# Patient Record
Sex: Female | Born: 1955 | Race: Black or African American | Hispanic: No | Marital: Single | Smoking: Current every day smoker
Health system: Southern US, Community
[De-identification: ages and names within clinical notes are randomized; demographics above are authoritative.]

## PROBLEM LIST (undated history)

## (undated) DIAGNOSIS — I2510 Atherosclerotic heart disease of native coronary artery without angina pectoris: Secondary | ICD-10-CM

## (undated) DIAGNOSIS — I10 Essential (primary) hypertension: Secondary | ICD-10-CM

---

## 2017-05-28 ENCOUNTER — Emergency Department: Payer: Self-pay

## 2017-05-28 ENCOUNTER — Encounter: Payer: Self-pay | Admitting: Emergency Medicine

## 2017-05-28 ENCOUNTER — Emergency Department
Admission: EM | Admit: 2017-05-28 | Discharge: 2017-05-28 | Disposition: A | Discharge status: Home / Self Care | Payer: Self-pay | Attending: Emergency Medicine | Admitting: Emergency Medicine

## 2017-05-28 DIAGNOSIS — L0211 Cutaneous abscess of neck: Secondary | ICD-10-CM | POA: Insufficient documentation

## 2017-05-28 DIAGNOSIS — I10 Essential (primary) hypertension: Secondary | ICD-10-CM | POA: Insufficient documentation

## 2017-05-28 DIAGNOSIS — L0291 Cutaneous abscess, unspecified: Secondary | ICD-10-CM

## 2017-05-28 DIAGNOSIS — F17210 Nicotine dependence, cigarettes, uncomplicated: Secondary | ICD-10-CM | POA: Insufficient documentation

## 2017-05-28 DIAGNOSIS — I2510 Atherosclerotic heart disease of native coronary artery without angina pectoris: Secondary | ICD-10-CM | POA: Insufficient documentation

## 2017-05-28 HISTORY — DX: Atherosclerotic heart disease of native coronary artery without angina pectoris: I25.10

## 2017-05-28 HISTORY — DX: Essential (primary) hypertension: I10

## 2017-05-28 LAB — CBC WITH DIFFERENTIAL/PLATELET
BASOS ABS: 0.1 10*3/uL (ref 0–0.1)
BASOS PCT: 1 %
Eosinophils Absolute: 0.2 10*3/uL (ref 0–0.7)
Eosinophils Relative: 2 %
HEMATOCRIT: 39.7 % (ref 35.0–47.0)
Hemoglobin: 13.6 g/dL (ref 12.0–16.0)
Lymphocytes Relative: 11 %
Lymphs Abs: 1.7 10*3/uL (ref 1.0–3.6)
MCH: 33.6 pg (ref 26.0–34.0)
MCHC: 34.3 g/dL (ref 32.0–36.0)
MCV: 98.1 fL (ref 80.0–100.0)
MONOS PCT: 9 %
Monocytes Absolute: 1.4 10*3/uL — ABNORMAL HIGH (ref 0.2–0.9)
NEUTROS ABS: 11.6 10*3/uL — AB (ref 1.4–6.5)
NEUTROS PCT: 77 %
Platelets: 328 10*3/uL (ref 150–440)
RBC: 4.05 MIL/uL (ref 3.80–5.20)
RDW: 15.5 % — ABNORMAL HIGH (ref 11.5–14.5)
WBC: 14.9 10*3/uL — ABNORMAL HIGH (ref 3.6–11.0)

## 2017-05-28 LAB — COMPREHENSIVE METABOLIC PANEL
ALK PHOS: 65 U/L (ref 38–126)
ALT: 11 U/L — ABNORMAL LOW (ref 14–54)
ANION GAP: 9 (ref 5–15)
AST: 15 U/L (ref 15–41)
Albumin: 3.8 g/dL (ref 3.5–5.0)
BUN: 11 mg/dL (ref 6–20)
CALCIUM: 9.7 mg/dL (ref 8.9–10.3)
CO2: 29 mmol/L (ref 22–32)
Chloride: 103 mmol/L (ref 101–111)
Creatinine, Ser: 0.42 mg/dL — ABNORMAL LOW (ref 0.44–1.00)
GFR calc non Af Amer: >60 mL/min (ref >60)
Glucose, Bld: 104 mg/dL — ABNORMAL HIGH (ref 65–99)
POTASSIUM: 3.6 mmol/L (ref 3.5–5.1)
SODIUM: 141 mmol/L (ref 135–145)
TOTAL PROTEIN: 8 g/dL (ref 6.5–8.1)
Total Bilirubin: 1.4 mg/dL — ABNORMAL HIGH (ref 0.3–1.2)

## 2017-05-28 LAB — LACTIC ACID, PLASMA: LACTIC ACID, VENOUS: 0.7 mmol/L (ref 0.5–1.9)

## 2017-05-28 MED ORDER — SULFAMETHOXAZOLE-TRIMETHOPRIM 800-160 MG PO TABS: 1.0000 | ORAL_TABLET | Freq: Two times a day (BID) | ORAL | 0 refills | Status: AC

## 2017-05-28 MED ORDER — CEFTRIAXONE SODIUM 250 MG IJ SOLR
250.0000 mg | INTRAMUSCULAR | Status: DC
  Administered 2017-05-28: 250 mg via INTRAMUSCULAR
  Filled 2017-05-28: qty 250

## 2017-05-28 MED ORDER — OXYCODONE-ACETAMINOPHEN 5-325 MG PO TABS: 1.0000 | ORAL_TABLET | Freq: Three times a day (TID) | ORAL | 0 refills | Status: AC | PRN

## 2017-05-28 MED ORDER — OXYCODONE-ACETAMINOPHEN 5-325 MG PO TABS
2.0000 | ORAL_TABLET | Freq: Once | ORAL | Status: AC
  Administered 2017-05-28: 2 via ORAL
  Filled 2017-05-28: qty 2

## 2017-05-28 MED ORDER — LIDOCAINE-EPINEPHRINE 1 %-1:100000 IJ SOLN
10.0000 mL | Freq: Once | INTRAMUSCULAR | Status: DC
  Filled 2017-05-28: qty 10

## 2017-05-28 NOTE — ED Provider Notes (Addendum)
[facility], [facility] Emergency Department Provider Note       Time seen: ----------------------------------------- 11:58 AM on 05/28/2017 -----------------------------------------     I have reviewed the triage vital signs and the nursing notes.   HISTORY   Chief Complaint No chief complaint on file.    HPI Heidi CoteVanessa Underwood is a 61 y.o. female with a history of hypertension who presents to the ED for a facial abscess. She was sent from urgent care for swelling to the chin and face. Reportedly she had dental surgery 6 weeks ago and she recently had angioedema from lisinopril. She just took a course of amoxicillin but she's not sure she was taking it. She arrives alert and oriented here and possibly needing a CT scan due to the swelling in her neck. Patient presents frustrated saying that she does not need to be here.  Past Medical History:  Diagnosis Date  . Coronary artery disease   . Hypertension     There are no active problems to display for this patient.   History reviewed. No pertinent surgical history.  Allergies Patient has no known allergies.  Social History Social History  Substance Use Topics  . Smoking status: Current Every Day Smoker    Packs/day: 0.50  . Smokeless tobacco: Never Used  . Alcohol use Yes     Comment: occasional    Review of Systems Constitutional: Negative for fever. Eyes: Negative for vision changes ENT:  Negative for congestion, sore throat. Negative for difficulty swallowing Cardiovascular: Negative for chest pain. Respiratory: Negative for shortness of breath. Gastrointestinal: Negative for abdominal pain, vomiting and diarrhea. Skin: Positive for facial swelling and abscess formation Neurological: Negative for headaches, focal weakness or numbness.  All systems negative/normal/unremarkable except as stated in the HPI  ____________________________________________   PHYSICAL EXAM:  VITAL SIGNS: ED Triage  Vitals  Enc Vitals Group     BP 05/28/17 1036 125/85     Pulse Rate 05/28/17 1036 76     Resp 05/28/17 1036 18     Temp 05/28/17 1036 (!) 97.5 F (36.4 C)     Temp Source 05/28/17 1036 Oral     SpO2 05/28/17 1036 100 %     Weight 05/28/17 1103 117 lb (53.1 kg)     Height 05/28/17 1103 5\' 6"  (1.676 m)     Head Circumference --      Peak Flow --      Pain Score --      Pain Loc --      Pain Edu? --      Excl. in GC? --     Constitutional: Alert and oriented. Well appearing and in no distress. Eyes: Conjunctivae are normal. Normal extraocular movements. ENT   Head: Normocephalic and atraumatic.   Nose: No congestion/rhinnorhea.   Mouth/Throat: Mucous membranes are moist. Posterior pharynx is normal.    Neck: No stridor. Extensive induration and erythema with point fluctuance near the left side of her chin Cardiovascular: Normal rate, regular rhythm. No murmurs, rubs, or gallops. Respiratory: Normal respiratory effort without tachypnea nor retractions. Breath sounds are clear and equal bilaterally. No wheezes/rales/rhonchi. Musculoskeletal: Nontender with normal range of motion in extremities. No lower extremity tenderness nor edema. Skin: Large abscess formation with induration and erythema in the submental area Psychiatric: Mood and affect are normal. Speech and behavior are normal.  ____________________________________________  ED COURSE:  Pertinent labs & imaging results that were available during my care of the patient were reviewed by me and considered  in my medical decision making (see chart for details). Patient presents for a chin abscess, we will assess with labs and perform incision and drainage. She will also receive a dose of IM antibiotics.   Marland Kitchen.Incision and Drainage Date/Time: 05/28/2017 12:01 PM Performed by: Emily Filbert Authorized by: Daryel November E   Consent:    Consent obtained:  Verbal   Consent given by:  Patient Location:     Type:  Abscess   Location:  Neck   Neck location:  L anterior Pre-procedure details:    Skin preparation:  Betadine Anesthesia (see MAR for exact dosages):    Anesthesia method:  Local infiltration   Local anesthetic:  Lidocaine 1% WITH epi Procedure type:    Complexity:  Simple Procedure details:    Needle aspiration: no     Incision types:  Single straight   Incision depth:  Subcutaneous   Scalpel blade:  11   Wound management:  Probed and deloculated   Drainage:  Purulent   Drainage amount:  Copious   Wound treatment:  Drain placed   Packing materials:  1/4 in gauze Post-procedure details:    Patient tolerance of procedure:  Tolerated well, no immediate complications   ____________________________________________   LABS (pertinent positives/negatives)  Labs Reviewed  COMPREHENSIVE METABOLIC PANEL - Abnormal; Notable for the following:       Result Value   Glucose, Bld 104 (*)    Creatinine, Ser 0.42 (*)    ALT 11 (*)    Total Bilirubin 1.4 (*)    All other components within normal limits  CBC WITH DIFFERENTIAL/PLATELET - Abnormal; Notable for the following:    WBC 14.9 (*)    RDW 15.5 (*)    Neutro Abs 11.6 (*)    Monocytes Absolute 1.4 (*)    All other components within normal limits  LACTIC ACID, PLASMA  LACTIC ACID, PLASMA  URINALYSIS, COMPLETE (UACMP) WITH MICROSCOPIC   ____________________________________________  DIFFERENTIAL DIAGNOSIS   Abscess, Ludwig's angina, cellulitis, branchial cleft cyst   FINAL ASSESSMENT AND PLAN  Abscess   Plan: Patient had presented for an abscess in the submental region. Patients labs did indicate leukocytosis. There was no imaging felt to be necessary. Swelling and induration patient becoming from abscess which was successfully drained with copious. Material. She was given antibiotics and will be discharged home with antibiotics, topical anabolic ointment and pain medicine. She is advised to follow-up in 1 days for  wound check.   Emily Filbert, MD   Note: This note was generated in part or whole with voice recognition software. Voice recognition is usually quite accurate but there are transcription errors that can and very often do occur. I apologize for any typographical errors that were not detected and corrected.     Emily Filbert, MD 05/28/17 1203    Emily Filbert, MD 05/28/17 [phone]

## 2017-05-28 NOTE — ED Triage Notes (Signed)
Pt from urgent care with swelling to chin and face. States that she recently (6 weeks ago) had dental surgery; more recently had allergic reaction to lisinopril. Pt just took course of amoxicillin; unclear indication for that. Pt not sure why she took abx. Pt alert & oriented.

## 2017-05-29 ENCOUNTER — Emergency Department
Admission: EM | Admit: 2017-05-29 | Discharge: 2017-05-29 | Disposition: A | Discharge status: Home / Self Care | Payer: Self-pay | Attending: Emergency Medicine | Admitting: Emergency Medicine

## 2017-05-29 ENCOUNTER — Encounter: Payer: Self-pay | Admitting: Emergency Medicine

## 2017-05-29 DIAGNOSIS — Z5189 Encounter for other specified aftercare: Secondary | ICD-10-CM

## 2017-05-29 DIAGNOSIS — F17200 Nicotine dependence, unspecified, uncomplicated: Secondary | ICD-10-CM | POA: Insufficient documentation

## 2017-05-29 DIAGNOSIS — Z4801 Encounter for change or removal of surgical wound dressing: Secondary | ICD-10-CM | POA: Insufficient documentation

## 2017-05-29 DIAGNOSIS — I2510 Atherosclerotic heart disease of native coronary artery without angina pectoris: Secondary | ICD-10-CM | POA: Insufficient documentation

## 2017-05-29 DIAGNOSIS — I10 Essential (primary) hypertension: Secondary | ICD-10-CM | POA: Insufficient documentation

## 2017-05-29 NOTE — ED Provider Notes (Signed)
[facility] Emergency Department Provider Note  ____________________________________________  Time seen: Approximately 10:00 PM  I have reviewed the triage vital signs and the nursing notes.   HISTORY  Chief Complaint Wound Check (abscess located underneath chin)    HPI Heidi Underwood is a 61 y.o. female who presents emergency department for wound check. Patient presented to the emergency Department yesterday with an abscess to the submental region left side. See previous notes for further details. Patient presents today reporting improvement in pain, erythema, edema. Patient has been taking antibiotics as prescribed. She reports keeping the area well dressed. Patient has had noticed some purulent drainage on dressing changes. Patient denies any fevers or chills, difficulty breathing or swallowing, chest pain or shortness of breath. No other complaints at this time.   Past Medical History:  Diagnosis Date  . Coronary artery disease   . Hypertension     There are no active problems to display for this patient.   History reviewed. No pertinent surgical history.  Prior to Admission medications   Medication Sig Start Date End Date Taking? Authorizing Provider  oxyCODONE-acetaminophen (PERCOCET) 5-325 MG tablet Take 1-2 tablets by mouth every 8 (eight) hours as needed. 05/28/17   Emily FilbertWilliams, Jonathan E, MD  sulfamethoxazole-trimethoprim (BACTRIM DS) 800-160 MG tablet Take 1 tablet by mouth 2 (two) times daily. 05/28/17   Emily FilbertWilliams, Jonathan E, MD    Allergies Patient has no known allergies.  History reviewed. No pertinent family history.  Social History Social History  Substance Use Topics  . Smoking status: Current Every Day Smoker    Packs/day: 0.50  . Smokeless tobacco: Never Used  . Alcohol use Yes     Comment: occasional     Review of Systems  Constitutional: No fever/chills Eyes: No visual changes. No discharge ENT: No upper respiratory  complaints. Cardiovascular: no chest pain. Respiratory: no cough. No SOB. Gastrointestinal: No abdominal pain.  No nausea, no vomiting. Musculoskeletal: Negative for musculoskeletal pain. Skin: Negative for rash, abrasions, lacerations, ecchymosis. Positive for abscess that has previously been incised and drained to the left submental region Neurological: Negative for headaches, focal weakness or numbness. 10-point ROS otherwise negative.  ____________________________________________   PHYSICAL EXAM:  VITAL SIGNS: ED Triage Vitals  Enc Vitals Group     BP 05/29/17 2121 128/79     Pulse Rate 05/29/17 2121 80     Resp 05/29/17 2121 16     Temp 05/29/17 2121 98.5 F (36.9 C)     Temp Source 05/29/17 2121 Oral     SpO2 05/29/17 2121 100 %     Weight 05/29/17 2121 117 lb (53.1 kg)     Height 05/29/17 2121 5\' 6"  (1.676 m)     Head Circumference --      Peak Flow --      Pain Score 05/29/17 2126 0     Pain Loc --      Pain Edu? --      Excl. in GC? --      Constitutional: Alert and oriented. Well appearing and in no acute distress. Eyes: Conjunctivae are normal. PERRL. EOMI. Head: Atraumatic. Incised and drained abscess noted to the left chin. Mild surrounding erythema and edema. Drainage is noted on dressing. When compared with previous notes, area is less erythematous and edematous. Wound is already showing signs of healing. ENT:      Ears:       Nose: No congestion/rhinnorhea.      Mouth/Throat: Mucous membranes are moist.  Neck: No stridor.   Hematological/Lymphatic/Immunilogical: No cervical lymphadenopathy. Cardiovascular: Normal rate, regular rhythm. Normal S1 and S2.  Good peripheral circulation. Respiratory: Normal respiratory effort without tachypnea or retractions. Lungs CTAB. Good air entry to the bases with no decreased or absent breath sounds. Musculoskeletal: Full range of motion to all extremities. No gross deformities appreciated. Neurologic:  Normal speech  and language. No gross focal neurologic deficits are appreciated.  Skin:  Skin is warm, dry and intact. No rash noted. Psychiatric: Mood and affect are normal. Speech and behavior are normal. Patient exhibits appropriate insight and judgement.   ____________________________________________   LABS (all labs ordered are listed, but only abnormal results are displayed)  Labs Reviewed - No data to display ____________________________________________  EKG   ____________________________________________  RADIOLOGY   No results found.  ____________________________________________    PROCEDURES  Procedure(s) performed:    Procedures    Medications - No data to display   ____________________________________________   INITIAL IMPRESSION / ASSESSMENT AND PLAN / ED COURSE  Pertinent labs & imaging results that were available during my care of the patient were reviewed by me and considered in my medical decision making (see chart for details).  Review of the [place] CSRS was performed in accordance of the [facility] prior to dispensing any controlled drugs.     Patient's diagnosis is consistent with a wound check. Patient is here for wound check of a abscess that was incised and drained yesterday. Patient has been taking her antibiotics as prescribed. Wound is showing signs of improvement from previous description. Patient reports good improvement. At this time, area is redressed and patient is to continue her antibiotics. Wound care structures are given to patient. At this time, patient does not need to return unless symptoms are worsening. She is given a list of symptoms to be concerned about and she verbalizes she will return for same. Otherwise, patient will continue her antibiotics and keep dressing changes..Patient is to follow up with primary care as needed or otherwise directed. Patient is given ED precautions to return to the ED for any worsening or new  symptoms.     ____________________________________________  FINAL CLINICAL IMPRESSION(S) / ED DIAGNOSES  Final diagnoses:  Wound check, abscess      NEW MEDICATIONS STARTED DURING THIS VISIT:  Discharge Medication List as of 05/29/2017 10:27 PM          This chart was dictated using voice recognition software/Dragon. Despite best efforts to proofread, errors can occur which can change the meaning. Any change was purely unintentional.    Racheal Patches, PA-C 05/29/17 2350    Sharman Cheek, MD 06/02/17 2342

## 2018-03-09 IMAGING — CR DG CHEST 2V
2 series · 2 of 2 positions shown · non-contrast
Comparison: None.

CLINICAL DATA: Facial swelling.

EXAM:
CHEST  2 VIEW

[chest pa]
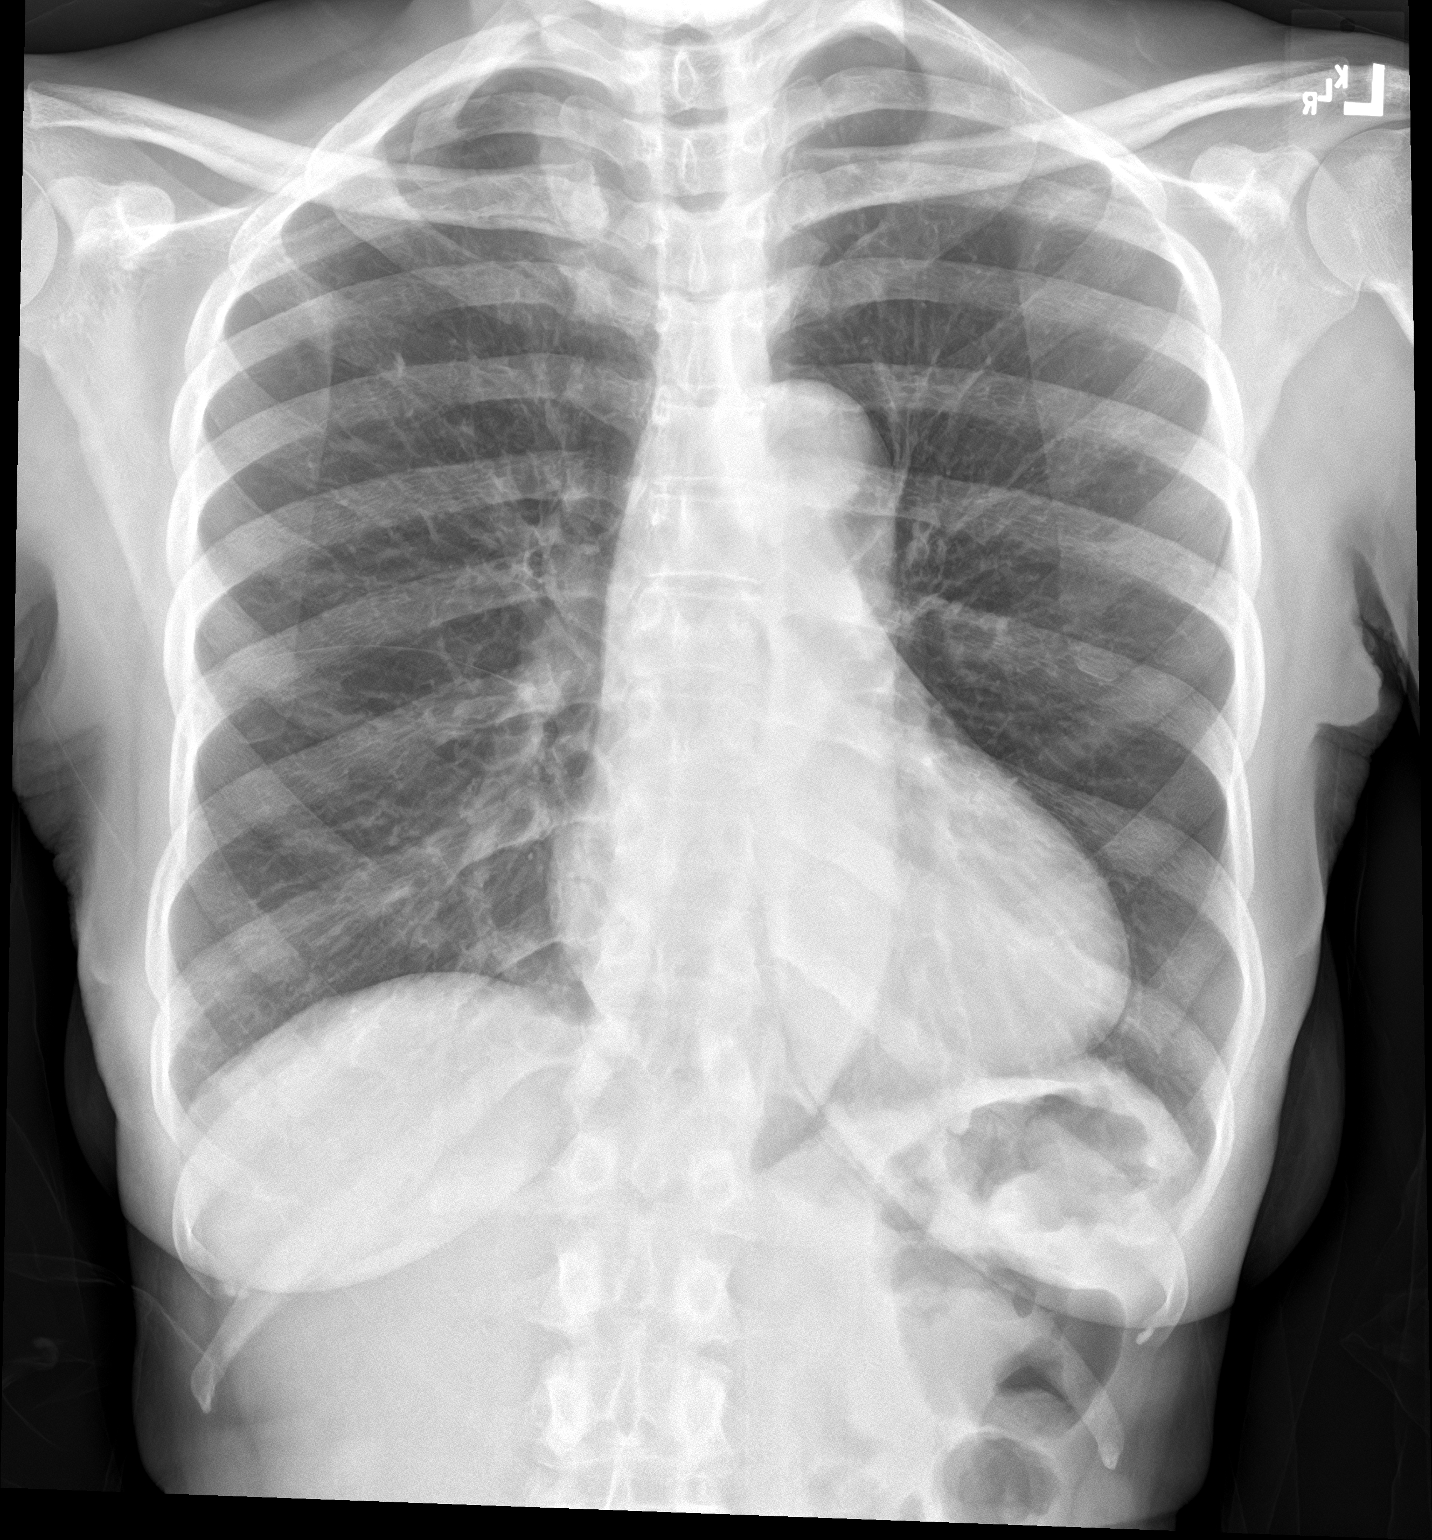

[chest lat]
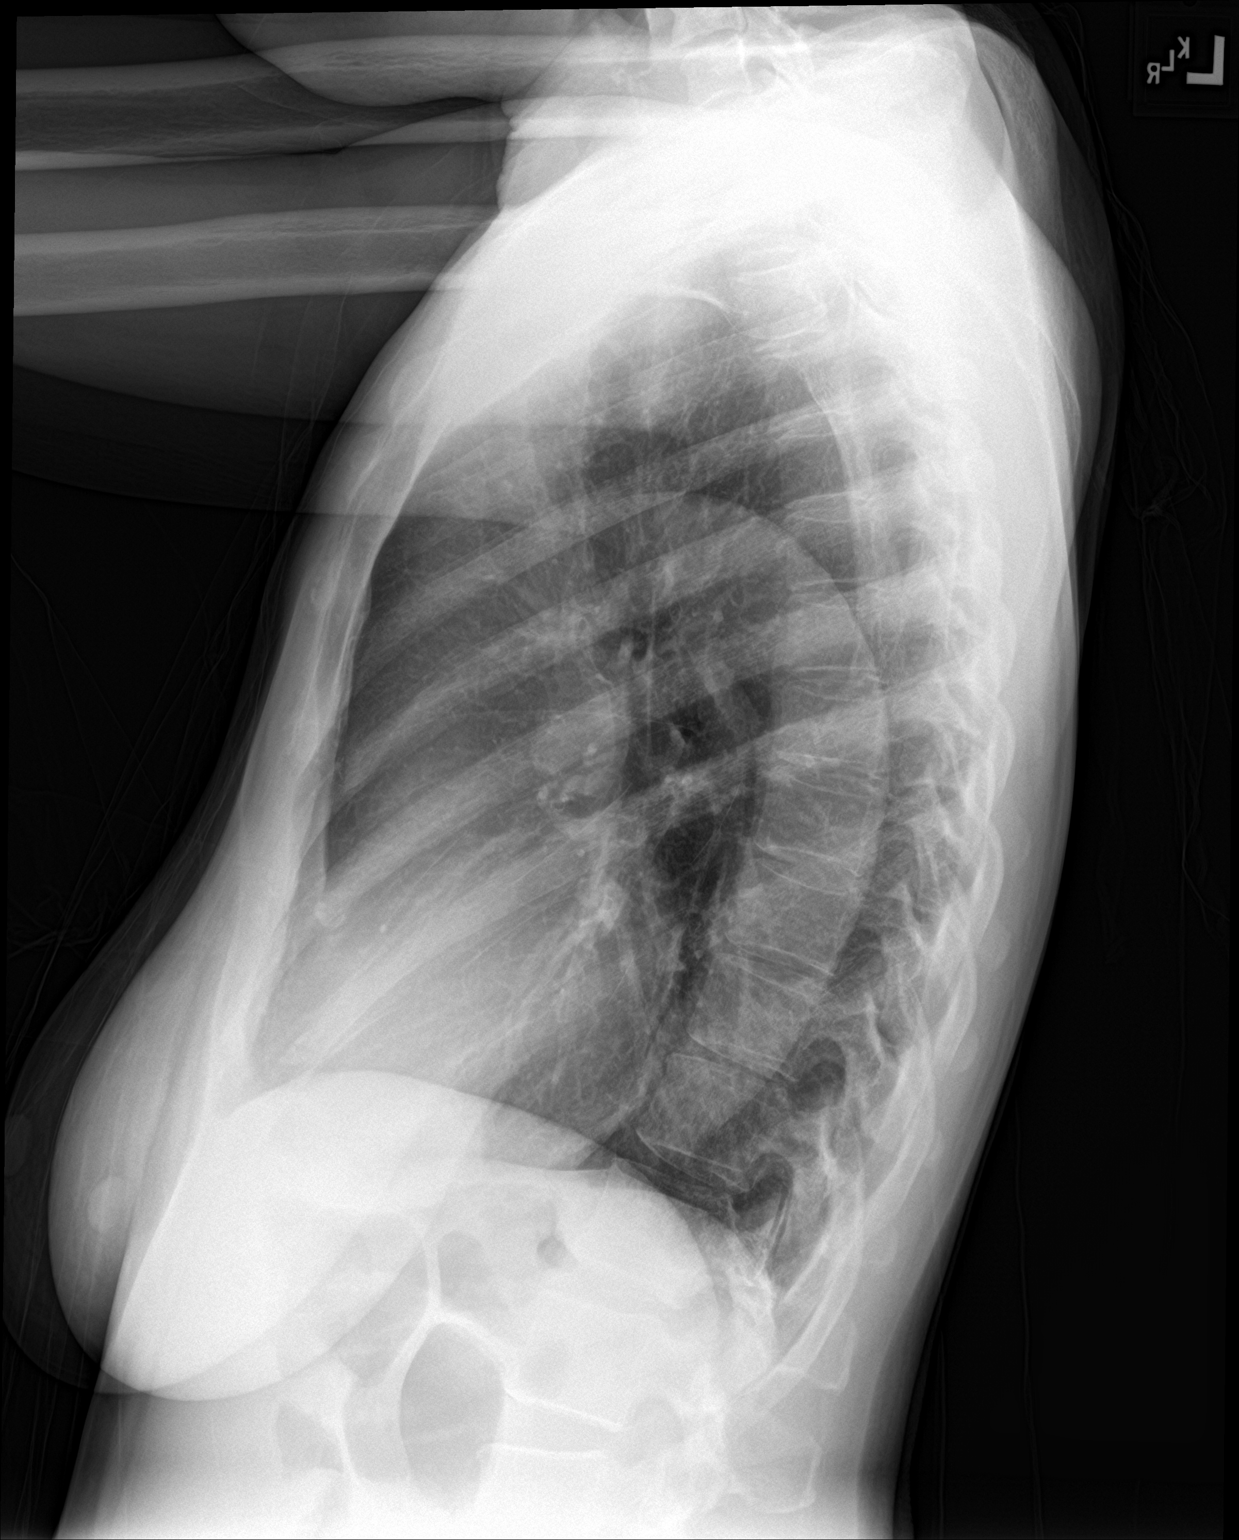

[2 of 2 positions shown; findings below may reference images not displayed]

FINDINGS: The heart size and mediastinal contours are within normal limits.
Both lungs are clear. The visualized skeletal structures are
unremarkable.
IMPRESSION: No active cardiopulmonary disease.
